# Patient Record
Sex: Female | Born: 1965 | Marital: Married | Smoking: Never smoker
Health system: Southern US, Community
[De-identification: ages and names within clinical notes are randomized; demographics above are authoritative.]

## PROBLEM LIST (undated history)

## (undated) DIAGNOSIS — M1990 Unspecified osteoarthritis, unspecified site: Secondary | ICD-10-CM

## (undated) DIAGNOSIS — R519 Headache, unspecified: Secondary | ICD-10-CM

## (undated) DIAGNOSIS — R51 Headache: Secondary | ICD-10-CM

---

## 2012-07-28 LAB — HM PAP SMEAR: HM Pap smear: NEGATIVE

## 2013-06-09 ENCOUNTER — Ambulatory Visit: Payer: Self-pay | Admitting: Internal Medicine

## 2015-03-25 LAB — HM MAMMOGRAPHY: HM MAMMO: NORMAL

## 2015-04-07 ENCOUNTER — Other Ambulatory Visit: Payer: Self-pay | Admitting: Obstetrics and Gynecology

## 2015-04-07 DIAGNOSIS — Z1231 Encounter for screening mammogram for malignant neoplasm of breast: Secondary | ICD-10-CM

## 2015-04-23 ENCOUNTER — Ambulatory Visit
Admission: RE | Admit: 2015-04-23 | Discharge: 2015-04-23 | Disposition: A | Discharge status: Home / Self Care | Payer: No Typology Code available for payment source | Source: Ambulatory Visit | Attending: Obstetrics and Gynecology | Admitting: Obstetrics and Gynecology

## 2015-04-23 DIAGNOSIS — Z1231 Encounter for screening mammogram for malignant neoplasm of breast: Secondary | ICD-10-CM | POA: Diagnosis not present

## 2015-04-23 LAB — HM MAMMOGRAPHY: HM Mammogram: NORMAL

## 2015-04-28 ENCOUNTER — Encounter: Payer: Self-pay | Admitting: *Deleted

## 2015-05-19 ENCOUNTER — Encounter: Payer: Self-pay | Admitting: Obstetrics and Gynecology

## 2015-06-16 ENCOUNTER — Encounter: Payer: Self-pay | Admitting: Obstetrics and Gynecology

## 2015-06-28 ENCOUNTER — Other Ambulatory Visit: Payer: Self-pay

## 2015-06-28 ENCOUNTER — Ambulatory Visit (INDEPENDENT_AMBULATORY_CARE_PROVIDER_SITE_OTHER): Payer: No Typology Code available for payment source | Admitting: Certified Nurse Midwife

## 2015-06-28 ENCOUNTER — Encounter: Payer: Self-pay | Admitting: Certified Nurse Midwife

## 2015-06-28 VITALS — BP 135/76 | HR 82 | Ht 58.6 in | Wt 135.4 lb

## 2015-06-28 DIAGNOSIS — R030 Elevated blood-pressure reading, without diagnosis of hypertension: Secondary | ICD-10-CM

## 2015-06-28 DIAGNOSIS — IMO0001 Reserved for inherently not codable concepts without codable children: Secondary | ICD-10-CM

## 2015-06-28 DIAGNOSIS — Z0000 Encounter for general adult medical examination without abnormal findings: Secondary | ICD-10-CM

## 2015-06-28 NOTE — Progress Notes (Signed)
ANNUAL PREVENTATIVE CARE GYN  ENCOUNTER NOTE  Subjective:       Jasmine Pratt is a 49 y.o. 461P1001 female here for a routine annual gynecologic exam.  Current complaints: 1.  Right heel pain and left knee pain while at work.  She takes 200 mg of Motrin PRN pain.    Gynecologic History No LMP recorded (approximate). Patient is not currently having periods (Reason: Perimenopausal). Contraception: coitus interruptus Last Pap: 3-4 years ago. Results were: normal Last mammogram: September 2016. Results were: normal  Obstetric History OB History  Gravida Para Term Preterm AB SAB TAB Ectopic Multiple Living  1 1 1       1     # Outcome Date GA Lbr Len/2nd Weight Sex Delivery Anes PTL Lv  1 Term 11/25/00   7 lb 7 oz (3.374 kg) F CS-Unspec  N Y      History reviewed. No pertinent past medical history.  Past Surgical History  Procedure Laterality Date  . Cesarean section      No current outpatient prescriptions on file prior to visit.   No current facility-administered medications on file prior to visit.    No Known Allergies  Social History   Social History  . Marital Status: Married    Spouse Name: N/A  . Number of Children: N/A  . Years of Education: N/A   Occupational History  . Not on file.   Social History Main Topics  . Smoking status: Never Smoker   . Smokeless tobacco: Never Used  . Alcohol Use: No  . Drug Use: No  . Sexual Activity: Yes   Other Topics Concern  . Not on file   Social History Narrative    Family History  Problem Relation Age of Onset  . Hypertension Mother   . Osteoarthritis Father     The following portions of the patient's history were reviewed and updated as appropriate: allergies, current medications, past family history, past medical history, past social history, past surgical history and problem list.  Review of Systems ROS Review of Systems - General ROS: negative for - chills, fatigue, fever, hot flashes, night sweats, weight  gain or weight loss Psychological ROS: negative for - anxiety, decreased libido, depression, mood swings, physical abuse or sexual abuse Ophthalmic ROS: negative for - blurry vision, eye pain or loss of vision ENT ROS: negative for - headaches, hearing change, visual changes or vocal changes Allergy and Immunology ROS: negative for - hives, itchy/watery eyes or seasonal allergies Hematological and Lymphatic ROS: negative for - bleeding problems, bruising, swollen lymph nodes or weight loss Endocrine ROS: negative for - galactorrhea, hair pattern changes, hot flashes, malaise/lethargy, mood swings, palpitations, polydipsia/polyuria, skin changes, temperature intolerance or unexpected weight changes Breast ROS: negative for - new or changing breast lumps or nipple discharge Respiratory ROS: negative for - cough or shortness of breath Cardiovascular ROS: negative for - chest pain, irregular heartbeat, palpitations or shortness of breath Gastrointestinal ROS: no abdominal pain, change in bowel habits, or black or bloody stools Genito-Urinary ROS: no dysuria, trouble voiding, or hematuria Musculoskeletal ROS: negative for - joint pain or joint stiffness Neurological ROS: negative for - bowel and bladder control changes Dermatological ROS: negative for rash and skin lesion changes   Objective:   BP 135/76 mmHg  Pulse 82  Ht 4' 10.6" (1.488 m)  Wt 135 lb 6 oz (61.406 kg)  BMI 27.73 kg/m2  LMP  (Approximate) CONSTITUTIONAL: Well-developed, well-nourished female in no acute distress.  PSYCHIATRIC: Normal  mood and affect. Normal behavior. Normal judgment and thought content. NEUROLGIC: Alert and oriented to person, place, and time. Normal muscle tone coordination. No cranial nerve deficit noted. HENT:  Normocephalic, atraumatic, External right and left ear normal. Oropharynx is clear and moist EYES: Conjunctivae and EOM are normal. Pupils are equal, round, and reactive to light. No scleral icterus.   NECK: Normal range of motion, supple, no masses.  Normal thyroid.  SKIN: Skin is warm and dry. No rash noted. Not diaphoretic. No erythema. No pallor. CARDIOVASCULAR: Normal heart rate noted, regular rhythm, no murmur. RESPIRATORY: Clear to auscultation bilaterally. Effort and breath sounds normal, no problems with respiration noted. BREASTS: Symmetric in size. No masses, skin changes, nipple drainage, or lymphadenopathy. ABDOMEN: Soft, normal bowel sounds, no distention noted.  No tenderness, rebound or guarding.  BLADDER: Normal PELVIC:  External Genitalia: Normal  BUS: Normal  Vagina: Normal  Cervix: Normal  Uterus: Normal  Adnexa: Normal  RV: External Exam NormaI, No Rectal Masses and Normal Sphincter tone  MUSCULOSKELETAL: Normal range of motion. No tenderness.  No cyanosis, clubbing, or edema.  2+ distal pulses. LYMPHATIC: No Axillary, Supraclavicular, or Inguinal Adenopathy.    Assessment:   Annual gynecologic examination 49 y.o. Contraception: coitus interruptus Overweight Problem List Items Addressed This Visit    None    Visit Diagnoses    Annual physical exam    -  Primary    Relevant Orders    Lipid panel    Glucose, random    Pap IG and HPV (high risk) DNA detection    Elevated BP        Relevant Orders    CBC    Comprehensive metabolic panel    TSH       Plan:  Pap: Pap Co Test Mammogram: Current & normal September 2016 Stool Guaiac Testing:  Not Indicated Labs: Lipid 1, FBS and TSH Routine preventative health maintenance measures emphasized: Exercise/Diet/Weight control Annual Education Discussed recommended Pap intervals   Self breast exams demonstrated & taught Mammogram after 40 Colonoscopy after 50.  Discussed nutrition, water intake and Ca+ intake. Exercise 30 to 60 minutes most days of the week. Maintenance of healthy body weight.   Discussed use of sun screen to prevent skin cancer  Discussed seat belt usage. Discussed elevated BP and  patient will monitor and will return if continues to be >140/90 and will refer to a PCP, BMP completed incase  Return to Clinic - 1 Year   Ardelia Mems, CNM

## 2015-06-30 ENCOUNTER — Other Ambulatory Visit: Payer: No Typology Code available for payment source

## 2015-06-30 LAB — CYTOLOGY - PAP

## 2015-07-01 LAB — LIPID PANEL
CHOL/HDL RATIO: 5 ratio — AB (ref 0.0–4.4)
Cholesterol, Total: 243 mg/dL — ABNORMAL HIGH (ref 100–199)
HDL: 49 mg/dL (ref >39)
LDL CALC: 171 mg/dL — AB (ref 0–99)
TRIGLYCERIDES: 115 mg/dL (ref 0–149)
VLDL CHOLESTEROL CAL: 23 mg/dL (ref 5–40)

## 2015-07-01 LAB — CBC
HEMATOCRIT: 39.7 % (ref 34.0–46.6)
Hemoglobin: 13.9 g/dL (ref 11.1–15.9)
MCH: 30.5 pg (ref 26.6–33.0)
MCHC: 35 g/dL (ref 31.5–35.7)
MCV: 87 fL (ref 79–97)
PLATELETS: 278 10*3/uL (ref 150–379)
RBC: 4.55 x10E6/uL (ref 3.77–5.28)
RDW: 13.3 % (ref 12.3–15.4)
WBC: 5.4 10*3/uL (ref 3.4–10.8)

## 2015-07-01 LAB — COMPREHENSIVE METABOLIC PANEL
ALK PHOS: 58 IU/L (ref 39–117)
ALT: 20 IU/L (ref 0–32)
AST: 18 IU/L (ref 0–40)
Albumin/Globulin Ratio: 2 (ref 1.1–2.5)
Albumin: 4.5 g/dL (ref 3.5–5.5)
BUN/Creatinine Ratio: 16 (ref 9–23)
BUN: 11 mg/dL (ref 6–24)
Bilirubin Total: 0.5 mg/dL (ref 0.0–1.2)
CALCIUM: 9.1 mg/dL (ref 8.7–10.2)
CO2: 24 mmol/L (ref 18–29)
CREATININE: 0.68 mg/dL (ref 0.57–1.00)
Chloride: 101 mmol/L (ref 97–106)
GFR calc Af Amer: 121 mL/min/{1.73_m2} (ref >59)
GFR, EST NON AFRICAN AMERICAN: 105 mL/min/{1.73_m2} (ref >59)
GLOBULIN, TOTAL: 2.2 g/dL (ref 1.5–4.5)
GLUCOSE: 89 mg/dL (ref 65–99)
POTASSIUM: 4 mmol/L (ref 3.5–5.2)
SODIUM: 141 mmol/L (ref 136–144)
Total Protein: 6.7 g/dL (ref 6.0–8.5)

## 2015-07-01 LAB — TSH: TSH: 2.16 u[IU]/mL (ref 0.450–4.500)

## 2015-07-06 ENCOUNTER — Other Ambulatory Visit: Payer: Self-pay | Admitting: Certified Nurse Midwife

## 2015-07-06 ENCOUNTER — Telehealth: Payer: Self-pay

## 2015-07-06 DIAGNOSIS — E7800 Pure hypercholesterolemia, unspecified: Secondary | ICD-10-CM

## 2015-07-06 NOTE — Telephone Encounter (Signed)
Left message to contact office

## 2015-07-06 NOTE — Telephone Encounter (Signed)
-----   Message from Ardelia MemsSharon Varner, CNM sent at 07/06/2015 10:22 AM EST ----- Called & left message for pt to return call.  She needs to eat a low fat diet and exercise 30 -60 minutes most days of the week.  We need to retest in 6 months.  If continues to be evelated will refer to PCP.  Please encourage patient to sign up for my chart.

## 2015-07-21 NOTE — Telephone Encounter (Signed)
Unable to reach pt. Contacted by phone and message left. No return call. Letter mailed to pt.

## 2016-04-25 ENCOUNTER — Other Ambulatory Visit: Payer: Self-pay | Admitting: Obstetrics and Gynecology

## 2016-04-25 DIAGNOSIS — Z1231 Encounter for screening mammogram for malignant neoplasm of breast: Secondary | ICD-10-CM

## 2016-05-22 ENCOUNTER — Ambulatory Visit
Admission: RE | Admit: 2016-05-22 | Discharge: 2016-05-22 | Disposition: A | Discharge status: Home / Self Care | Payer: BLUE CROSS/BLUE SHIELD | Source: Ambulatory Visit | Attending: Obstetrics and Gynecology | Admitting: Obstetrics and Gynecology

## 2016-05-22 DIAGNOSIS — Z1231 Encounter for screening mammogram for malignant neoplasm of breast: Secondary | ICD-10-CM | POA: Insufficient documentation

## 2016-06-29 ENCOUNTER — Ambulatory Visit (INDEPENDENT_AMBULATORY_CARE_PROVIDER_SITE_OTHER): Payer: BLUE CROSS/BLUE SHIELD | Admitting: Obstetrics and Gynecology

## 2016-06-29 ENCOUNTER — Encounter: Payer: Self-pay | Admitting: Obstetrics and Gynecology

## 2016-06-29 VITALS — BP 143/81 | HR 80 | Ht 59.0 in | Wt 140.2 lb

## 2016-06-29 DIAGNOSIS — J302 Other seasonal allergic rhinitis: Secondary | ICD-10-CM

## 2016-06-29 DIAGNOSIS — Z01419 Encounter for gynecological examination (general) (routine) without abnormal findings: Secondary | ICD-10-CM

## 2016-06-29 DIAGNOSIS — Z1239 Encounter for other screening for malignant neoplasm of breast: Secondary | ICD-10-CM

## 2016-06-29 DIAGNOSIS — Z1231 Encounter for screening mammogram for malignant neoplasm of breast: Secondary | ICD-10-CM

## 2016-06-29 DIAGNOSIS — N951 Menopausal and female climacteric states: Secondary | ICD-10-CM

## 2016-06-29 DIAGNOSIS — E8809 Other disorders of plasma-protein metabolism, not elsewhere classified: Secondary | ICD-10-CM | POA: Diagnosis not present

## 2016-06-29 DIAGNOSIS — E785 Hyperlipidemia, unspecified: Secondary | ICD-10-CM

## 2016-06-29 DIAGNOSIS — R779 Abnormality of plasma protein, unspecified: Secondary | ICD-10-CM

## 2016-06-29 MED ORDER — CETIRIZINE-PSEUDOEPHEDRINE ER 5-120 MG PO TB12: 1.0000 | ORAL_TABLET | Freq: Two times a day (BID) | ORAL | 6 refills | Status: DC

## 2016-06-29 NOTE — Progress Notes (Signed)
GYNECOLOGY ANNUAL PHYSICAL EXAM PROGRESS NOTE  Subjective:    Jasmine Pratt is a 50 y.o. [place] G1P1001 married menopausal (x 1 year) female who presents for an annual exam. The patient has no complaints today. The patient is sexually active. The patient is not taking hormone replacement therapy. Patient denies post-menopausal vaginal bleeding. The patient wears seatbelts: yes. The patient participates in regular exercise: no. Has the patient ever been transfused or tattooed?: no. The patient reports that there is not domestic violence in her life.   [place] interpreter present for today's visit.   Gynecologic History No LMP recorded. Patient is not currently having periods (Reason: Post-menopausal). Contraception: post menopausal status History of STI's: Denies Last Pap: 06/28/2015. Results were: normal.  Denies/Notes h/o abnormal pap smears. Last mammogram: 05/22/2016. Results were: normal   Obstetric History   G1   P1   T1   P0   A0   L1    SAB0   TAB0   Ectopic0   Multiple0   Live Births1     # Outcome Date GA Lbr Len/2nd Weight Sex Delivery Anes PTL Lv  1 Term 11/25/00   7 lb 7 oz (3.374 kg) F CS-Unspec  N LIV      History reviewed. No pertinent past medical history.  Past Surgical History:  Procedure Laterality Date  . CESAREAN SECTION      Family History  Problem Relation Age of Onset  . Hypertension Mother   . Osteoarthritis Father   . Breast cancer Neg Hx     Social History   Social History  . Marital status: Married    Spouse name: N/A  . Number of children: N/A  . Years of education: N/A   Occupational History  . Not on file.   Social History Main Topics  . Smoking status: Never Smoker  . Smokeless tobacco: Never Used  . Alcohol use No  . Drug use: No  . Sexual activity: Yes    Birth control/ protection: None   Other Topics Concern  . Not on file   Social History Narrative  . No narrative on file    No current outpatient  prescriptions on file prior to visit.   No current facility-administered medications on file prior to visit.     No Known Allergies   Review of Systems Constitutional: negative for chills, fatigue, fevers and sweats Eyes: negative for irritation, redness and visual disturbance Ears, nose, mouth, throat, and face: negative for hearing loss, nasal congestion, snoring and tinnitus Respiratory: negative for asthma, cough, sputum Cardiovascular: negative for chest pain, dyspnea, exertional chest pressure/discomfort, irregular heart beat, palpitations and syncope Gastrointestinal: negative for abdominal pain, change in bowel habits, nausea and vomiting Genitourinary: negative for abnormal menstrual periods, genital lesions, sexual problems and vaginal discharge, dysuria and urinary incontinence Integument/breast: negative for breast lump, breast tenderness and nipple discharge Hematologic/lymphatic: negative for bleeding and easy bruising Musculoskeletal:negative for back pain and muscle weakness Neurological: negative for dizziness, headaches, vertigo and weakness Endocrine: Positive for occasional hot flushes and night sweats. Negative for diabetic symptoms including polydipsia, polyuria and skin dryness Allergic/Immunologic: Positive for tonsil swelling and throat irritation with season changes. Negative for hay fever and urticaria        Objective:  Blood pressure (!) 143/81, pulse 80, height 4\' 11"  (1.499 m), weight 140 lb 3.2 oz (63.6 kg). Body mass index is 28.32 kg/m.  General Appearance:    Alert, cooperative, no distress, appears stated age, overweight  Head:    Normocephalic, without obvious abnormality, atraumatic  Eyes:    PERRL, conjunctiva/corneas clear, EOM's intact, both eyes  Ears:    Normal external ear canals, both ears  Nose:   Nares normal, septum midline, mucosa normal, no drainage or sinus tenderness  Throat:   Lips, mucosa, and tongue normal; teeth and gums normal   Neck:   Supple, symmetrical, trachea midline, no adenopathy; thyroid: no enlargement/tenderness/nodules; no carotid bruit or JVD  Back:     Symmetric, no curvature, ROM normal, no CVA tenderness  Lungs:     Clear to auscultation bilaterally, respirations unlabored  Chest Wall:    No tenderness or deformity   Heart:    Regular rate and rhythm, S1 and S2 normal, no murmur, rub or gallop  Breast Exam:    No tenderness, masses, or nipple abnormality  Abdomen:     Soft, non-tender, bowel sounds active all four quadrants, no masses, no organomegaly.   Well healed, faint Pfannenstiel incision.   Genitalia:    Pelvic:external genitalia normal, vagina without lesions, discharge, or tenderness, rectovaginal septum  normal. Cervix normal in appearance, no cervical motion tenderness, no adnexal masses or tenderness.  Uterus normal size, shape, mobile, regular contours, nontender.  Rectal:    Normal external sphincter.  No hemorrhoids appreciated. Internal exam not done.   Extremities:   Extremities normal, atraumatic, no cyanosis or edema  Pulses:   2+ and symmetric all extremities  Skin:   Skin color, texture, turgor normal, no rashes or lesions  Lymph nodes:   Cervical, supraclavicular, and axillary nodes normal  Neurologic:   CNII-XII intact, normal strength, sensation and reflexes throughout   .  Labs:  Lab Results  Component Value Date   WBC 5.4 06/30/2015   HCT 39.7 06/30/2015   MCV 87 06/30/2015   PLT 278 06/30/2015    Lab Results  Component Value Date   CREATININE 0.68 06/30/2015   BUN 11 06/30/2015   NA 141 06/30/2015   K 4.0 06/30/2015   CL 101 06/30/2015   CO2 24 06/30/2015    Lab Results  Component Value Date   ALT 20 06/30/2015   AST 18 06/30/2015   ALKPHOS 58 06/30/2015   BILITOT 0.5 06/30/2015    Lab Results  Component Value Date   TSH 2.160 06/30/2015   Lab Results  Component Value Date   CHOL 243 (H) 06/30/2015    HDL 49 06/30/2015    LDLCALC 171 (H)  06/30/2015    TRIG 115 06/30/2015    CHOLHDL 5.0 (H) 06/30/2015    Assessment:   Healthy female exam.  Menopausal vasomotor symptoms, mild Dyslipidemia Elevated BP Seasonal allergies  Plan:     Blood tests: CBC with diff, Comprehensive metabolic panel, Lipoproteins and Total cholesterol. Breast self exam technique reviewed and patient encouraged to perform self-exam monthly. Contraception: post menopausal status. Discussed healthy lifestyle modifications. Mammogram up to date.  Next due in 2018. Pap smear up to datre.  Next due in 2019.  Seasonal allergies, prescribed Zyrtec Vasomotor symptoms mild. Discussed lifestyle interventions such as wearing light clothing, remaining in cool environments, having fan/air conditioner in the room, avoiding hot beverages etc.  Also discussed alternative therapies such as herbal remedies but cautioned that most of the products contained phytoestrogens (plant estrogens) in unregulated amounts which can have the same effects on the body as the pharmaceutical estrogen preparations.  Patient notes symptoms are mild, not really bothersome.   Patient with mildly elevated BP today,  no prior h/o HTN.  If BP elevated at a subsequent visit, likely with newly diagnosed HTN.  Advised patient to check BP at home (i.e. Grocery stores/pharmacy, or can purchase home machine).    Hildred LaserAnika Stoy Fenn, MD [facility]

## 2016-06-30 LAB — CBC
HEMOGLOBIN: 13.7 g/dL (ref 11.1–15.9)
Hematocrit: 38.9 % (ref 34.0–46.6)
MCH: 29.7 pg (ref 26.6–33.0)
MCHC: 35.2 g/dL (ref 31.5–35.7)
MCV: 84 fL (ref 79–97)
Platelets: 276 10*3/uL (ref 150–379)
RBC: 4.61 x10E6/uL (ref 3.77–5.28)
RDW: 13.6 % (ref 12.3–15.4)
WBC: 5.6 10*3/uL (ref 3.4–10.8)

## 2016-06-30 LAB — COMPREHENSIVE METABOLIC PANEL
A/G RATIO: 2 (ref 1.2–2.2)
ALK PHOS: 69 IU/L (ref 39–117)
ALT: 55 IU/L — AB (ref 0–32)
AST: 26 IU/L (ref 0–40)
Albumin: 4.6 g/dL (ref 3.5–5.5)
BUN/Creatinine Ratio: 24 — ABNORMAL HIGH (ref 9–23)
BUN: 15 mg/dL (ref 6–24)
Bilirubin Total: 0.4 mg/dL (ref 0.0–1.2)
CALCIUM: 9.2 mg/dL (ref 8.7–10.2)
CHLORIDE: 100 mmol/L (ref 96–106)
CO2: 26 mmol/L (ref 18–29)
Creatinine, Ser: 0.63 mg/dL (ref 0.57–1.00)
GFR calc Af Amer: 124 mL/min/{1.73_m2} (ref >59)
GFR calc non Af Amer: 107 mL/min/{1.73_m2} (ref >59)
GLOBULIN, TOTAL: 2.3 g/dL (ref 1.5–4.5)
Glucose: 94 mg/dL (ref 65–99)
POTASSIUM: 3.8 mmol/L (ref 3.5–5.2)
SODIUM: 142 mmol/L (ref 134–144)
Total Protein: 6.9 g/dL (ref 6.0–8.5)

## 2016-06-30 LAB — LIPID PANEL
CHOL/HDL RATIO: 4.7 ratio — AB (ref 0.0–4.4)
Cholesterol, Total: 245 mg/dL — ABNORMAL HIGH (ref 100–199)
HDL: 52 mg/dL (ref >39)
LDL CALC: 160 mg/dL — AB (ref 0–99)
TRIGLYCERIDES: 164 mg/dL — AB (ref 0–149)
VLDL Cholesterol Cal: 33 mg/dL (ref 5–40)

## 2017-04-25 ENCOUNTER — Other Ambulatory Visit: Payer: Self-pay | Admitting: Obstetrics and Gynecology

## 2017-05-28 ENCOUNTER — Encounter: Payer: Self-pay | Admitting: Radiology

## 2017-05-28 ENCOUNTER — Ambulatory Visit
Admission: RE | Admit: 2017-05-28 | Discharge: 2017-05-28 | Disposition: A | Discharge status: Home / Self Care | Payer: BLUE CROSS/BLUE SHIELD | Source: Ambulatory Visit | Attending: Obstetrics and Gynecology | Admitting: Obstetrics and Gynecology

## 2017-05-28 DIAGNOSIS — Z1239 Encounter for other screening for malignant neoplasm of breast: Secondary | ICD-10-CM

## 2017-05-28 DIAGNOSIS — Z1231 Encounter for screening mammogram for malignant neoplasm of breast: Secondary | ICD-10-CM | POA: Insufficient documentation

## 2017-07-06 ENCOUNTER — Encounter: Payer: Self-pay | Admitting: Obstetrics and Gynecology

## 2017-07-11 ENCOUNTER — Ambulatory Visit (INDEPENDENT_AMBULATORY_CARE_PROVIDER_SITE_OTHER): Payer: BLUE CROSS/BLUE SHIELD | Admitting: Certified Nurse Midwife

## 2017-07-11 ENCOUNTER — Encounter: Payer: Self-pay | Admitting: Certified Nurse Midwife

## 2017-07-11 VITALS — BP 132/84 | HR 72 | Ht 59.0 in | Wt 137.0 lb

## 2017-07-11 DIAGNOSIS — Z0000 Encounter for general adult medical examination without abnormal findings: Secondary | ICD-10-CM

## 2017-07-11 NOTE — Patient Instructions (Signed)
Jasmine Pratt phng ng?a 40-64 tu?i, N? gi?i Preventive Care 40-64 Years, Female Jasmine Pratt phng ng?a ?? c?p ??n cc l?a ch?n l?i s?ng v th?m khm v?i chuyn gia Jasmine [place] s?c kh?e c th? t?ng c??ng s?c kh?e v h?nh phc. Jasmine Pratt phng ng?a bao g?m nh?ng g?  Khm s?c kh?e hng n?m. L?n khm ny c?ng ???c g?i l ki?m tra s?c kh?e hng n?m.  Khm nha khoa m?i n?m m?t ho?c hai l?n.  Khm m?t ??nh k?. Hy h?i chuyn gia Jasmine [place] s?c kh?e v? vi?c bao lu th qu v? nn khm m?t m?t l?n.  Cc l?a ch?n l?i s?ng c nhn, bao g?m: ? Jasmine [place] r?ng v l?i hng ngy. ? Hoa?t ??ng th? ch?t th???ng xuyn. ? ?n ch? ?? ?n lnh m?nh. ? Hessie Diener s? d?ng thu?c l v ma ty. ? H?n ch? s? d?ng r??u. ? Quan h? tnh d?c an ton. ? Dng aspirin li?u th?p hng ngy b?t ??u ? tu?i 50. ? Dng th?c ph?m b? sung vitamin v ch?t khong theo ch? d?n c?a chuyn gia Jasmine [place] s?c kh?e. Nh?ng g x?y ra trong khi ki?m tra s?c kh?e hng n?m? Cc d?ch v? v xt nghi?m sng l?c do chuyn gia Jasmine [place] s?c kh?e th?c hi?n trong khi khm s?c kh?e hng n?m s? ph? thu?c vo tu?i, s?c kh?e t?ng th?, cc y?u t? nguy c? t? l?i s?ng v b?nh s? gia ?nh c?a qu v?. T? v?n Chuyn gia Jasmine [place] s?c kh?e c th? h?i qu v? v? vi?c:  S? d?ng r??u.  S? d?ng thu?c l.  S? d?ng ma ty.  Tnh tr?ng s?c kh?e tinh th?n.  Tnh tr?ng h?nh phc ? nh v trong m?i quan h?.  Quan h? tnh d?c.  Thi quen ?n u?ng.  Cng vi?c v mi tr??ng lm vi?c.  Ph??ng php ng?a [place].  Chu k? kinh nguy?t.  L?ch s? mang thai.  Sng l?c Qu v? c th? ???c lm cc xt nghi?m ho?c php ?o sau:  Chi?u cao, cn n?ng v BMI.  Huy?t p.  N?ng ?? lipid v cholesterol. L??ng cc ch?t ny c th? ???c ki?m tra m?i 5 n?m m?t l?n ho?c th??ng xuyn h?n n?u qu v? trn [place].  Sng l?c ung th? ph?i. Qu v? c th? ???c lm xt nghi?m sng l?c ny m?i n?m b?t ??u ? tu?i 57 n?u qu v? c ti?n s? ht 30 gi thu?c m?t n?m v hi?n ?ang ht thu?c ho?c ?  b? thu?c trong vng 15 n?m tr??c.  Xt nghi?m mu ?n trong phn (FOBT). Qu v? c th? ???c lm xt nghi?m ny m?i n?m b?t ??u ? tu?i 50.  N?i soi ??i trng ho?c n?i soi ??i trng sigma ?ng m?m. Qu v? c th? ???c n?i soi ??i trng sigma m?i 5 n?m m?t l?n ho?c n?i soi ??i trng m?i 10 n?m m?t l?n b?t ??u ? tu?i 50.  Xt nghi?m mu tm vim gan C.  Xt nghi?m mu tm vim gan B.  Xt nghi?m b?nh ly truy?n qua ???ng tnh d?c (STD).  Sng l?c ti?u ???ng. Xt nghi?m ny ???c th?c hi?n b?ng cch ki?m tra ???ng huy?t (glucose) sau khi qu v? khng ?n g trong m?t kho?ng th?i gian (nh?n ?i). Qu v? c th? ???c lm xt nghi?m ny m?i 1-3 n?m m?t l?n.  Ch?p X quang tuy?n v. Xt nghi?m ny c th? ???c th?c hi?n m?i 1-2  n?m m?t l?n. Hy trao ??i v?i chuyn gia Jasmine [place] s?c kh?e v? vi?c khi no qu v? nn b?t ??u ch?p X quang tuy?n v hng n?m. ?i?u ny c th? ph? thu?c vo vi?c qu v? c ti?n s? gia ?nh b? ung th? v hay khng.  Sng l?c ung th? lin quan ??n BRCA. Xt nghi?m ny c th? ???c th?c hi?n n?u qu v? c ti?n s? gia ?nh b? ung th? v, bu?ng tr?ng, ?ng d?n tr?ng ho?c ung th? phc m?c.  Khm khung ch?u v xt nghi?m ph?t t? bo c? t? cung (Pap). Nh?ng xt nghi?m ny c th? ???c th?c hi?n m?i 3 n?m m?t l?n b?t ??u t? tu?i 21. B?t ??u ? tu?i 30, xt nghi?m ny c th? ???c th?c hi?n m?i 5 n?m m?t l?n n?u qu v? ???c lm xt nghi?m Pap k?t h?p v?i xt nghi?m HPV.  Ch?p m?t ?? x??ng. Xt nghi?m ny ???c th?c hi?n ?? sng l?c b?nh long x??ng. Qu v? c th? ???c ch?p m?t ?? x??ng n?u c nguy c? cao b? long x??ng.  Hy th?o lu?n v?i chuyn gia Jasmine [place] s?c kh?e cu?a qu v? v? cc k?t qu? xt nghi?m, cc ph??ng n ?i?u tr? v n?u c?n, nhu c?u lm thm xt nghi?m. V?c xin Chuyn gia Jasmine [place] s?c kh?e c th? khuy?n ngh? m?t s? v?c xin nh?t ??nh, ch?ng h?n nh?:  V?c xin cm. V?c xin ny ???c khuy?n ngh? hng n?m.  V?c xin u?n vn, b?ch h?u v ho g v bo (Tdap, Td). Qu v? c th? c?n m?t li?u Td  nh?c l?i m?i 10 n?m m?t l?n.  V?c xin th?y ??u. Qu v? c th? c?n v?c xin ny n?u ch?a ???c chch ng?a.  V?c xin zoster. Qu v? c th? c?n v?c xin ny sau tu?i 60.  V?c xin s?i, quai b? v rubella (MMR). Qu v? c th? c?n t nh?t m?t li?u MMR n?u sinh ra vo n?m 1957 ho?c sau ?Sander Nephew v? c?ng c?n li?u th? hai.  V?c xin lin h?p ph? c?u khu?n 13 gi (PCV13). Qu v? c th? c?n v?c xin ny n?u c m?t s? tnh tr?ng b?nh l nh?t ??nh v tr??c ?y ch?a ???c chch ng?a.  V?c xin ph? c?u khu?n polysaccharide (PPSV23). Qu v? c th? c?n m?t ho?c hai li?u n?u qu v? ht thu?c ho?c n?u qu v? c m?t s? tnh tr?ng b?nh l nh?t ??nh.  V?c xin vim mng no. Qu v? c th? c?n v?c xin ny n?u c cc tnh tr?ng nh?t ??nh.  V?c xin vim gan A. Qu v? c th? c?n v?c xin ny n?u c m?t s? tnh tr?ng nh?t ??nh ho?c n?u qu v? ?i du l?ch ho?c lm vi?c ? nh?ng n?i m qu v? c th? b? ph?i nhi?m vim gan A.  V?c xin vim gan B. Qu v? c th? c?n v?c xin ny n?u c m?t s? tnh tr?ng b?nh l nh?t ??nh ho?c n?u qu v? ?i du l?ch ho?c lm vi?c ? nh?ng n?i m qu v? c th? b? ph?i nhi?m vim gan B.  V?c xin Haemophilus influenzae tup b (Hib). Qu v? c th? c?n v?c xin ny n?u c cc tnh tr?ng nh?t ??nh.  Hy trao ??i v?i chuyn gia Jasmine  s?c kh?e v? nh?ng xt nghi?m sng l?c v v?c xin no m qu v? c?n v bao lu th c?n cc v?c xin ? m?t l?n. Thng tin  ny khng nh?m m?c ?ch thay th? cho l?i khuyn m chuyn gia Jasmine [place] s?c kh?e ni v?i qu v?. Hy b?o ??m qu v? ph?i th?o lu?n b?t k? v?n ?? g m qu v? c v?i chuyn gia Jasmine [place] s?c kh?e c?a qu v?. Document Released: 10/26/2016 Document Revised: 10/26/2016 Elsevier Interactive Patient Education  [employer].

## 2017-07-11 NOTE — Progress Notes (Signed)
GYNECOLOGY ANNUAL PREVENTATIVE CARE ENCOUNTER NOTE  Subjective:   Jasmine Pratt is a 51 y.o. 361P1001 female here for a routine annual gynecologic exam.  Current complaints: none.  She is here with an interpretor.  Denies abnormal vaginal bleeding, discharge, pelvic pain, problems with intercourse or other gynecologic concerns.    Gynecologic History Patient's last menstrual period was 02/22/2015. Contraception: none Last Pap: 06/28/2015. Results were: normal Last mammogram: 05/28/2017. Results were: normalThere are no findings suspicious for malignancy. Images were processed with CAD  Obstetric History OB History  Gravida Para Term Preterm AB Living  1 1 1     1   SAB TAB Ectopic Multiple Live Births          1    # Outcome Date GA Lbr Len/2nd Weight Sex Delivery Anes PTL Lv  1 Term 11/25/00   7 lb 7 oz (3.374 kg) F CS-Unspec  N LIV      History reviewed. No pertinent past medical history.  Past Surgical History:  Procedure Laterality Date  . CESAREAN SECTION      Current Outpatient Medications on File Prior to Visit  Medication Sig Dispense Refill  . cetirizine-pseudoephedrine (ZYRTEC-D) 5-120 MG tablet Take 1 tablet by mouth 2 (two) times daily. (Patient not taking: Reported on 07/11/2017) 30 tablet 6   No current facility-administered medications on file prior to visit.     No Known Allergies  Social History   Socioeconomic History  . Marital status: Married    Spouse name: Not on file  . Number of children: Not on file  . Years of education: Not on file  . Highest education level: Not on file  Social Needs  . Financial resource strain: Not on file  . Food insecurity - worry: Not on file  . Food insecurity - inability: Not on file  . Transportation needs - medical: Not on file  . Transportation needs - non-medical: Not on file  Occupational History  . Not on file  Tobacco Use  . Smoking status: Never Smoker  . Smokeless tobacco: Never Used  Substance  and Sexual Activity  . Alcohol use: No  . Drug use: No  . Sexual activity: Yes    Birth control/protection: None  Other Topics Concern  . Not on file  Social History Narrative  . Not on file    Family History  Problem Relation Age of Onset  . Hypertension Mother   . Osteoarthritis Father   . Breast cancer Neg Hx     The following portions of the patient's history were reviewed and updated as appropriate: allergies, current medications, past family history, past medical history, past social history, past surgical history and problem list.  Review of Systems Constitutional: negative Eyes: negative Ears, nose, mouth, throat, and face: negative Respiratory: negative Cardiovascular: negative Gastrointestinal: negative Genitourinary:negative Integument/breast: negative Hematologic/lymphatic: negative Musculoskeletal:negative Neurological: negative Behavioral/Psych: negative Endocrine: negative Allergic/Immunologic: negative   Objective:  BP 132/84 (BP Location: Right Arm, Patient Position: Sitting, Cuff Size: Normal)   Pulse 72   Ht 4\' 11"  (1.499 m)   Wt 137 lb (62.1 kg)   LMP 02/22/2015 Comment: 4-5 months ago  BMI 27.67 kg/m  CONSTITUTIONAL: Well-developed, well-nourished female in no acute distress.  HENT:  Normocephalic, atraumatic, External right and left ear normal. Oropharynx is clear and moist EYES: Conjunctivae and EOM are normal. Pupils are equal, round, and reactive to light. No scleral icterus.  NECK: Normal range of motion, supple, no masses.  Normal thyroid.  SKIN: Skin is warm and dry. No rash noted. Not diaphoretic. No erythema. No pallor. NEUROLOGIC: Alert and oriented to person, place, and time. Normal reflexes, muscle tone coordination. No cranial nerve deficit noted. PSYCHIATRIC: Normal mood and affect. Normal behavior. Normal judgment and thought content. CARDIOVASCULAR: Normal heart rate noted, regular rhythm RESPIRATORY: Clear to auscultation  bilaterally. Effort and breath sounds normal, no problems with respiration noted. BREASTS: Symmetric in size. No masses, skin changes, nipple drainage, or lymphadenopathy. ABDOMEN: Soft, normal bowel sounds, no distention noted.  No tenderness, rebound or guarding.  PELVIC: Normal appearing external genitalia; normal appearing vaginal mucosa and cervix.  No abnormal discharge noted.  Pap smear not indicated, due next year.  Normal uterine size, no other palpable masses, no uterine or adnexal tenderness. MUSCULOSKELETAL: Normal range of motion. No tenderness.  No cyanosis, clubbing, or edema.  2+ distal pulses.   Assessment and Plan:  Annual well women exam  Pap not indicated.  Mammogram already completed. Discussed need for colonoscopy next year @ 51 yrs old. Routine preventative health maintenance measures emphasized. Please refer to After Visit Summary for other counseling recommendations.    Doreene BurkeAnnie Tirza [place], CNM

## 2018-04-22 ENCOUNTER — Other Ambulatory Visit: Payer: Self-pay | Admitting: Certified Nurse Midwife

## 2018-04-22 DIAGNOSIS — Z1231 Encounter for screening mammogram for malignant neoplasm of breast: Secondary | ICD-10-CM

## 2018-05-30 ENCOUNTER — Ambulatory Visit
Admission: RE | Admit: 2018-05-30 | Discharge: 2018-05-30 | Disposition: A | Discharge status: Home / Self Care | Payer: BLUE CROSS/BLUE SHIELD | Source: Ambulatory Visit | Attending: Certified Nurse Midwife | Admitting: Certified Nurse Midwife

## 2018-05-30 DIAGNOSIS — Z1231 Encounter for screening mammogram for malignant neoplasm of breast: Secondary | ICD-10-CM | POA: Diagnosis present

## 2018-07-12 ENCOUNTER — Ambulatory Visit (INDEPENDENT_AMBULATORY_CARE_PROVIDER_SITE_OTHER): Payer: BLUE CROSS/BLUE SHIELD | Admitting: Certified Nurse Midwife

## 2018-07-12 ENCOUNTER — Encounter: Payer: Self-pay | Admitting: Certified Nurse Midwife

## 2018-07-12 VITALS — BP 138/87 | HR 74 | Ht 59.0 in | Wt 143.4 lb

## 2018-07-12 DIAGNOSIS — R0781 Pleurodynia: Secondary | ICD-10-CM | POA: Diagnosis not present

## 2018-07-12 DIAGNOSIS — Z1211 Encounter for screening for malignant neoplasm of colon: Secondary | ICD-10-CM | POA: Diagnosis not present

## 2018-07-12 NOTE — Progress Notes (Signed)
GYNECOLOGY ANNUAL PREVENTATIVE CARE ENCOUNTER NOTE  Subjective:   Jasmine Pratt is a 52 y.o. 151P1001 female here for a routine annual gynecologic exam.  Current complaints: left sided rib pain just below the left breast. Started 2 wks ago that has worsened.    Denies abnormal vaginal bleeding, discharge, pelvic pain, problems with intercourse or other gynecologic concerns.    Gynecologic History Patient's last menstrual period was 02/22/2015. Contraception: none Last Pap: 06/28/2015. Results were: normal with negative HPV Last mammogram: 05/2018. Results were: normal  Obstetric History OB History  Gravida Para Term Preterm AB Living  1 1 1     1   SAB TAB Ectopic Multiple Live Births          1    # Outcome Date GA Lbr Len/2nd Weight Sex Delivery Anes PTL Lv  1 Term 11/25/00   7 lb 7 oz (3.374 kg) F CS-Unspec  N LIV    No past medical history on file.  Past Surgical History:  Procedure Laterality Date  . CESAREAN SECTION      No current outpatient medications on file prior to visit.   No current facility-administered medications on file prior to visit.     No Known Allergies  Social History:  reports that she has never smoked. She has never used smokeless tobacco. She reports that she does not drink alcohol or use drugs.  Family History  Problem Relation Age of Onset  . Hypertension Mother   . Osteoarthritis Father   . Breast cancer Neg Hx     The following portions of the patient's history were reviewed and updated as appropriate: allergies, current medications, past family history, past medical history, past social history, past surgical history and problem list.  Review of Systems Pertinent items noted in HPI and remainder of comprehensive ROS otherwise negative.   Objective:  BP 138/87   Pulse 74   Ht 4\' 11"  (1.499 m)   Wt 143 lb 6 oz (65 kg)   LMP 02/22/2015 Comment: 4-5 months ago  BMI 28.96 kg/m  CONSTITUTIONAL: Well-developed, well-nourished  female in no acute distress.  HENT:  Normocephalic, atraumatic, External right and left ear normal. Oropharynx is clear and moist EYES: Conjunctivae and EOM are normal. Pupils are equal, round, and reactive to light. No scleral icterus.  NECK: Normal range of motion, supple, no masses.  Normal thyroid.  SKIN: Skin is warm and dry. No rash noted. Not diaphoretic. No erythema. No pallor. MUSCULOSKELETAL: Normal range of motion. Tenderness left rib under breast. No mass felt, tender with palpation.  No cyanosis, clubbing, or edema.  2+ distal pulses. NEUROLOGIC: Alert and oriented to person, place, and time. Normal reflexes, muscle tone coordination. No cranial nerve deficit noted. PSYCHIATRIC: Normal mood and affect. Normal behavior. Normal judgment and thought content. CARDIOVASCULAR: Normal heart rate noted, regular rhythm RESPIRATORY: Clear to auscultation bilaterally. Effort and breath sounds normal, no problems with respiration noted. BREASTS: Symmetric in size. No masses, skin changes, nipple drainage, or lymphadenopathy. ABDOMEN: Soft, normal bowel sounds, no distention noted.  No tenderness, rebound or guarding.  PELVIC: Normal appearing external genitalia; normal appearing vaginal mucosa and cervix.  No abnormal discharge noted.  Pap smear not indicated.  Normal uterine size, no other palpable masses, no uterine or adnexal tenderness.    Assessment and Plan:  1. Colon cancer screening - Ambulatory referral to Gastroenterology  2. [facility]  Pap smear not indicated Mammogram due 05/2019  Labs: none due Encouraged use of 600 mg motrin for rib pain.Heat or ice to affected area. Discussed common musculoskeletal discomforts Routine preventative health maintenance measures emphasized. Please refer to After Visit Summary for other counseling recommendations.   Doreene BurkeAnnie Anabell Swint, CNM

## 2018-07-12 NOTE — Patient Instructions (Signed)
Preventive Care 40-64 Years, Female Preventive care refers to lifestyle choices and visits with your health care provider that can promote health and wellness. What does preventive care include?   A yearly physical exam. This is also called an annual well check.  Dental exams once or twice a year.  Routine eye exams. Ask your health care provider how often you should have your eyes checked.  Personal lifestyle choices, including: ? Daily care of your teeth and gums. ? Regular physical activity. ? Eating a healthy diet. ? Avoiding tobacco and drug use. ? Limiting alcohol use. ? Practicing safe sex. ? Taking low-dose aspirin daily starting at age 50. ? Taking vitamin and mineral supplements as recommended by your health care provider. What happens during an annual well check? The services and screenings done by your health care provider during your annual well check will depend on your age, overall health, lifestyle risk factors, and family history of disease. Counseling Your health care provider may ask you questions about your:  Alcohol use.  Tobacco use.  Drug use.  Emotional well-being.  Home and relationship well-being.  Sexual activity.  Eating habits.  Work and work environment.  Method of birth control.  Menstrual cycle.  Pregnancy history. Screening You may have the following tests or measurements:  Height, weight, and BMI.  Blood pressure.  Lipid and cholesterol levels. These may be checked every 5 years, or more frequently if you are over 50 years old.  Skin check.  Lung cancer screening. You may have this screening every year starting at age 55 if you have a 30-pack-year history of smoking and currently smoke or have quit within the past 15 years.  Colorectal cancer screening. All adults should have this screening starting at age 50 and continuing until age 75. Your health care provider may recommend screening at age 45. You will have tests every  1-10 years, depending on your results and the type of screening test. People at increased risk should start screening at an earlier age. Screening tests may include: ? Guaiac-based fecal occult blood testing. ? Fecal immunochemical test (FIT). ? Stool DNA test. ? Virtual colonoscopy. ? Sigmoidoscopy. During this test, a flexible tube with a tiny camera (sigmoidoscope) is used to examine your rectum and lower colon. The sigmoidoscope is inserted through your anus into your rectum and lower colon. ? Colonoscopy. During this test, a long, thin, flexible tube with a tiny camera (colonoscope) is used to examine your entire colon and rectum.  Hepatitis C blood test.  Hepatitis B blood test.  Sexually transmitted disease (STD) testing.  Diabetes screening. This is done by checking your blood sugar (glucose) after you have not eaten for a while (fasting). You may have this done every 1-3 years.  Mammogram. This may be done every 1-2 years. Talk to your health care provider about when you should start having regular mammograms. This may depend on whether you have a family history of breast cancer.  BRCA-related cancer screening. This may be done if you have a family history of breast, ovarian, tubal, or peritoneal cancers.  Pelvic exam and Pap test. This may be done every 3 years starting at age 21. Starting at age 30, this may be done every 5 years if you have a Pap test in combination with an HPV test.  Bone density scan. This is done to screen for osteoporosis. You may have this scan if you are at high risk for osteoporosis. Discuss your test results, treatment options,   and if necessary, the need for more tests with your health care provider. Vaccines Your health care provider may recommend certain vaccines, such as:  Influenza vaccine. This is recommended every year.  Tetanus, diphtheria, and acellular pertussis (Tdap, Td) vaccine. You may need a Td booster every 10 years.  Varicella  vaccine. You may need this if you have not been vaccinated.  Zoster vaccine. You may need this after age 38.  Measles, mumps, and rubella (MMR) vaccine. You may need at least one dose of MMR if you were born in 1957 or later. You may also need a second dose.  Pneumococcal 13-valent conjugate (PCV13) vaccine. You may need this if you have certain conditions and were not previously vaccinated.  Pneumococcal polysaccharide (PPSV23) vaccine. You may need one or two doses if you smoke cigarettes or if you have certain conditions.  Meningococcal vaccine. You may need this if you have certain conditions.  Hepatitis A vaccine. You may need this if you have certain conditions or if you travel or work in places where you may be exposed to hepatitis A.  Hepatitis B vaccine. You may need this if you have certain conditions or if you travel or work in places where you may be exposed to hepatitis B.  Haemophilus influenzae type b (Hib) vaccine. You may need this if you have certain conditions. Talk to your health care provider about which screenings and vaccines you need and how often you need them. This information is not intended to replace advice given to you by your health care provider. Make sure you discuss any questions you have with your health care provider. Document Released: 08/06/2015 Document Revised: 08/30/2017 Document Reviewed: 05/11/2015 Elsevier Interactive Patient Education  2019 [employer].

## 2018-07-31 ENCOUNTER — Telehealth: Payer: Self-pay

## 2018-07-31 NOTE — Telephone Encounter (Signed)
Returned patients daughters call to schedule colonoscopy.  Daughter had to clock in for work and was unable to talk.  She will call back tomorrow.  Daughter/[name] [phone]  Thanks Marcelino Duster

## 2018-07-31 NOTE — Telephone Encounter (Signed)
Pt daughter is calling to schedule procedure for pt cb [phone]

## 2018-08-05 ENCOUNTER — Other Ambulatory Visit: Payer: Self-pay

## 2018-08-05 ENCOUNTER — Telehealth: Payer: Self-pay | Admitting: Gastroenterology

## 2018-08-05 ENCOUNTER — Telehealth: Payer: Self-pay

## 2018-08-05 DIAGNOSIS — Z1211 Encounter for screening for malignant neoplasm of colon: Secondary | ICD-10-CM

## 2018-08-05 NOTE — Telephone Encounter (Signed)
Contacted patients daughter to see if she wanted to schedule her mothers colonoscopy.  She stated that she would call me back in 30 minutes.  Thanks [employer]

## 2018-08-05 NOTE — Telephone Encounter (Signed)
[name] contacted office [facility] her [facility]. Order has been placed in depot for [facility].  Thanks [employer] FinancialMichelle

## 2018-08-05 NOTE — Telephone Encounter (Signed)
Luci (daughter) is returning [name]'s call to schedule her mom a colonoscopy.

## 2018-08-05 NOTE — Telephone Encounter (Signed)
Colonoscopy scheduled wih Dr. Tobi Bastos for 08/19/18 at [facility].  Thanks [employer]

## 2018-08-13 ENCOUNTER — Encounter: Payer: Self-pay | Admitting: Anesthesiology

## 2018-08-13 ENCOUNTER — Telehealth: Payer: Self-pay | Admitting: Gastroenterology

## 2018-08-13 ENCOUNTER — Other Ambulatory Visit: Payer: Self-pay

## 2018-08-13 NOTE — Telephone Encounter (Signed)
Patient's daughter called & wants to cancel patient's colonoscopy scheduled with Dr Servando Snare on 08-19-2018 because then ins will not cover until she turns 52.

## 2018-08-14 NOTE — Telephone Encounter (Signed)
[facility]  I tried to contact Valentina GuLucy patients daughter to clarify reason for cancellation, her voicemail was full.  She stated in phone message that her mothers insurance would not cover until the age of 51.  Her mother is [phone].  Colonoscopy canceled per daughters request.  Thanks Marcelino DusterMichelle

## 2018-08-19 ENCOUNTER — Ambulatory Visit: Admit: 2018-08-19 | Payer: BLUE CROSS/BLUE SHIELD | Admitting: Gastroenterology

## 2018-08-19 HISTORY — DX: Unspecified osteoarthritis, unspecified site: M19.90

## 2018-08-19 HISTORY — DX: Headache: R51

## 2018-08-19 HISTORY — DX: Headache, unspecified: R51.9

## 2018-08-19 SURGERY — COLONOSCOPY WITH PROPOFOL
Anesthesia: General

## 2018-08-19 SURGERY — COLONOSCOPY WITH PROPOFOL
Anesthesia: Choice

## 2018-10-25 ENCOUNTER — Telehealth: Payer: Self-pay

## 2018-10-25 NOTE — Telephone Encounter (Signed)
Used [place] interpreter # [phone]. Pt called no answer interpreter LM informing pt that I would send a text to her phone to set up her Mychart so she could communicate with her provider.

## 2019-07-14 ENCOUNTER — Encounter: Payer: BLUE CROSS/BLUE SHIELD | Admitting: Certified Nurse Midwife

## 2019-07-15 ENCOUNTER — Encounter: Payer: BLUE CROSS/BLUE SHIELD | Admitting: Certified Nurse Midwife

## 2019-10-14 ENCOUNTER — Ambulatory Visit: Payer: Self-pay

## 2019-10-18 ENCOUNTER — Ambulatory Visit: Payer: Self-pay | Attending: Internal Medicine

## 2019-10-18 DIAGNOSIS — Z23 Encounter for immunization: Secondary | ICD-10-CM

## 2019-10-18 NOTE — Progress Notes (Signed)
   Covid-19 Vaccination Clinic  Name:  Jasmine Pratt    MRN: [id number] DOB: [birth year]  10/18/2019  [name] was observed post Covid-19 immunization for 15 minutes without incident. She was provided with Vaccine Information Sheet and instruction to access the V-Safe system.   [name] was instructed to call 911 with any severe reactions post vaccine: Marland Kitchen Difficulty breathing  . Swelling of face and throat  . A fast heartbeat  . A bad rash all over body  . Dizziness and weakness   Immunizations Administered    Name Date Dose VIS Date Route   Moderna COVID-19 Vaccine 10/18/2019  8:21 AM 0.5 mL 06/24/2019 Intramuscular   Manufacturer: Moderna   Lot: [id number]   NDC: 72091-068-16

## 2019-11-23 IMAGING — MG DIGITAL SCREENING BILATERAL MAMMOGRAM WITH TOMO AND CAD
5 series · 6 of 9 positions shown · non-contrast
Comparison: Previous exam(s).

CLINICAL DATA: Screening.

EXAM:
DIGITAL SCREENING BILATERAL MAMMOGRAM WITH TOMO AND CAD

[L CC synth-2D]
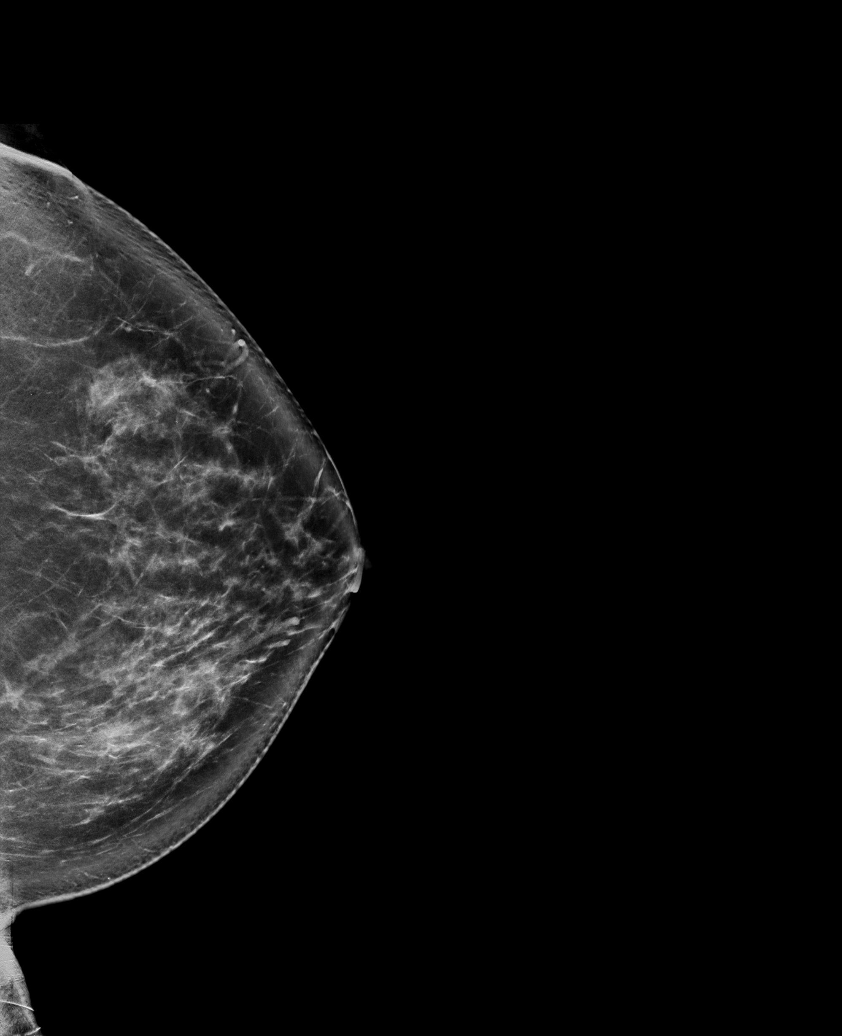

[R MLO synth-2D]
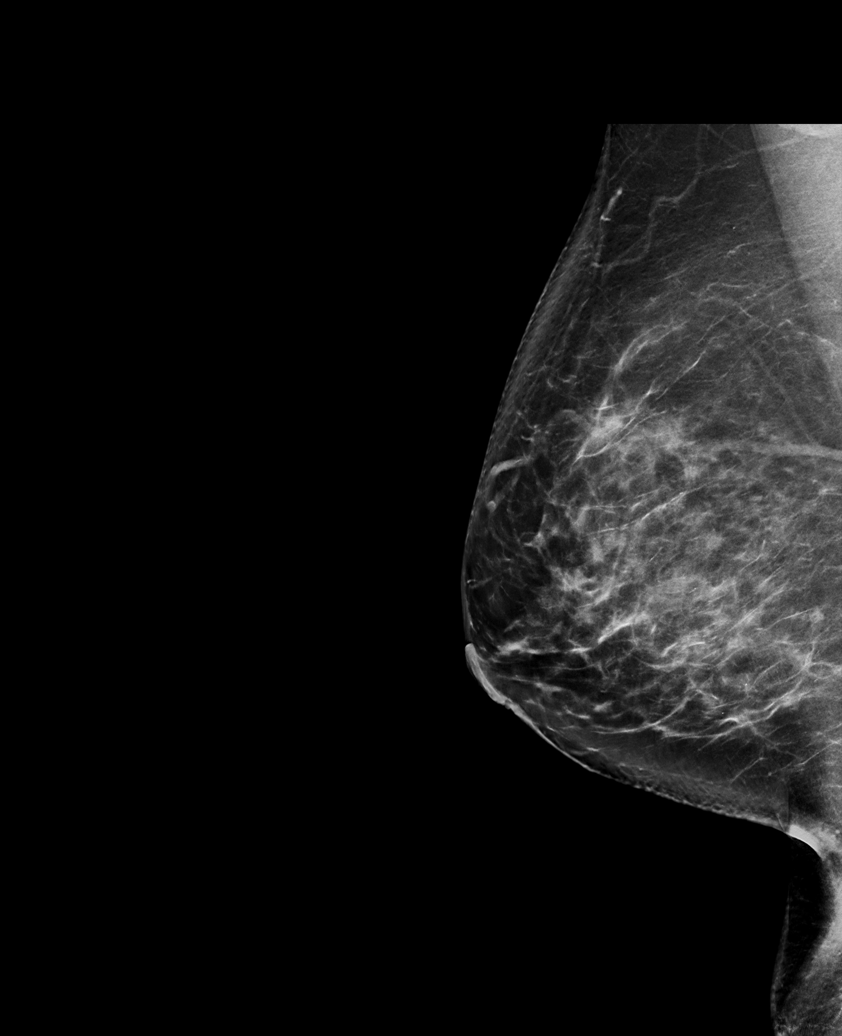

[L MLO synth-2D]
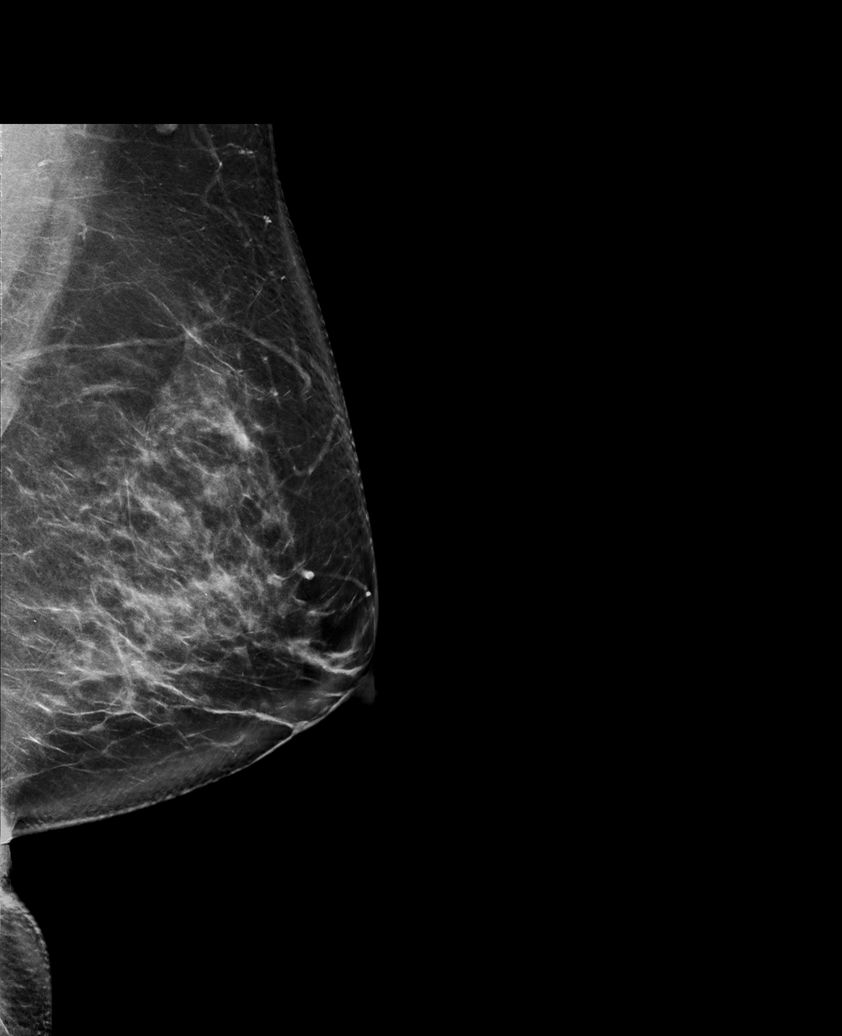

[R CC synth-2D]
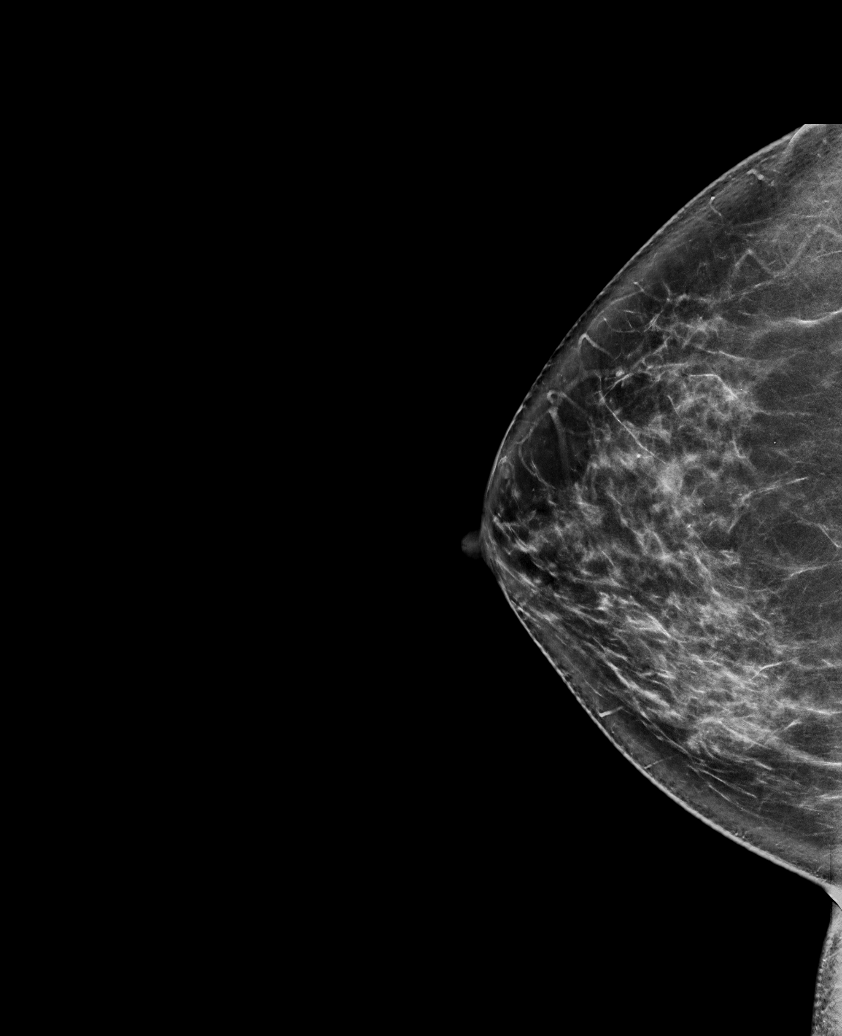

[L CC tomo · 2 of 91 frames shown]
[frame 30/91]
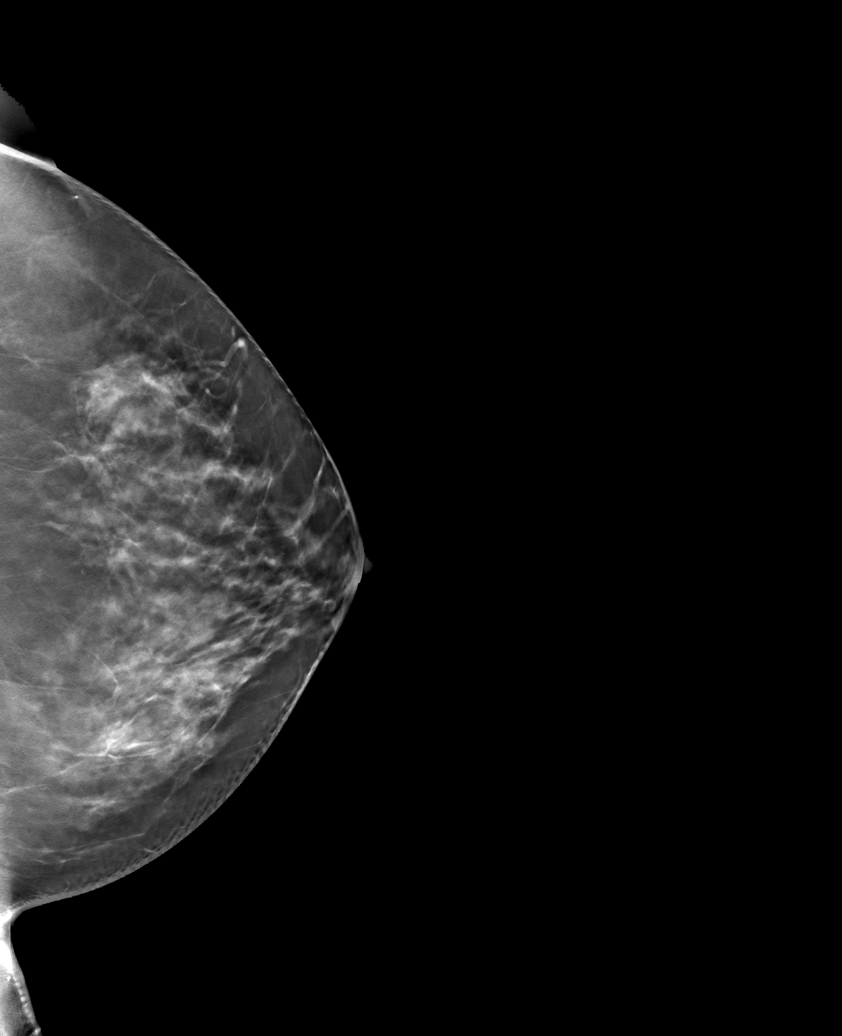
[frame 46/91]
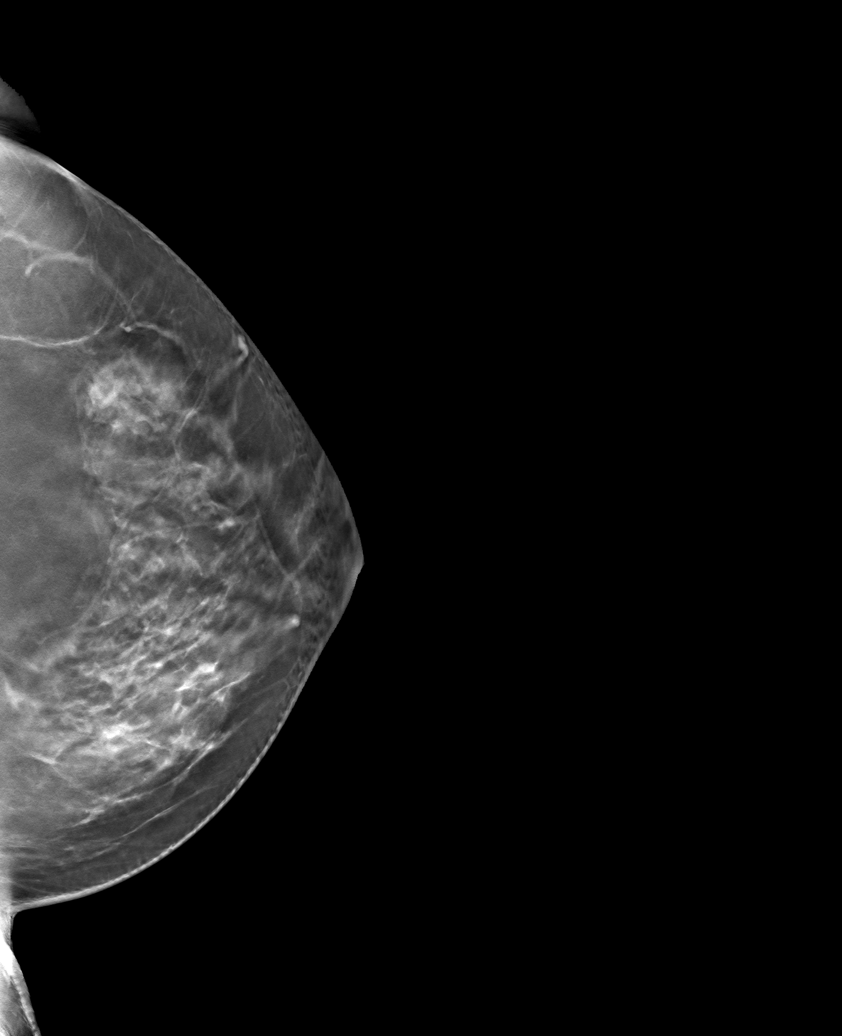

[6 of 9 positions shown; findings below may reference images not displayed]

ACR Breast Density Category c: The breast tissue is heterogeneously
dense, which may obscure small masses.
FINDINGS: There are no findings suspicious for malignancy. Images were
processed with CAD.
IMPRESSION: No mammographic evidence of malignancy. A result letter of this
screening mammogram will be mailed directly to the patient.

RECOMMENDATION:
Screening mammogram in one year. (Code:[id number])

BI-RADS CATEGORY  1: Negative.

## 2021-04-04 LAB — EXTERNAL GENERIC LAB PROCEDURE: COLOGUARD: NEGATIVE

## 2022-09-22 ENCOUNTER — Other Ambulatory Visit: Payer: Self-pay | Admitting: Family

## 2022-09-22 DIAGNOSIS — Z1231 Encounter for screening mammogram for malignant neoplasm of breast: Secondary | ICD-10-CM

## 2022-09-26 ENCOUNTER — Ambulatory Visit
Admission: RE | Admit: 2022-09-26 | Discharge: 2022-09-26 | Disposition: A | Discharge status: Home / Self Care | Payer: 59 | Source: Ambulatory Visit | Attending: Family | Admitting: Family

## 2022-09-26 DIAGNOSIS — Z1231 Encounter for screening mammogram for malignant neoplasm of breast: Secondary | ICD-10-CM | POA: Diagnosis not present

## 2023-11-06 ENCOUNTER — Encounter: Payer: Self-pay | Admitting: Family

## 2023-11-29 ENCOUNTER — Encounter: Payer: Self-pay | Admitting: Family
